# Patient Record
Sex: Male | Born: 2005 | Race: White | Hispanic: No | Marital: Single
Health system: Southern US, Community
[De-identification: ages and names within clinical notes are randomized; demographics above are authoritative.]

---
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
## 2023-07-23 ENCOUNTER — Emergency Department
Admission: EM | Admit: 2023-07-23 | Discharge: 2023-07-23 | Disposition: A | Discharge status: Home / Self Care | Payer: 59 | Attending: Emergency Medicine | Admitting: Emergency Medicine

## 2023-07-23 ENCOUNTER — Emergency Department: Payer: Self-pay

## 2023-07-23 ENCOUNTER — Other Ambulatory Visit: Payer: Self-pay

## 2023-07-23 ENCOUNTER — Encounter: Payer: Self-pay | Admitting: Emergency Medicine

## 2023-07-23 DIAGNOSIS — G44309 Post-traumatic headache, unspecified, not intractable: Secondary | ICD-10-CM | POA: Insufficient documentation

## 2023-07-23 DIAGNOSIS — S0990XA Unspecified injury of head, initial encounter: Secondary | ICD-10-CM | POA: Diagnosis not present

## 2023-07-23 DIAGNOSIS — F0781 Postconcussional syndrome: Secondary | ICD-10-CM | POA: Insufficient documentation

## 2023-07-23 DIAGNOSIS — R519 Headache, unspecified: Secondary | ICD-10-CM | POA: Diagnosis not present

## 2023-07-23 DIAGNOSIS — S199XXA Unspecified injury of neck, initial encounter: Secondary | ICD-10-CM | POA: Diagnosis not present

## 2023-07-23 NOTE — Discharge Instructions (Addendum)
Your exam and CT scans are normal and reassuring at this time.  No signs of a serious head injury.  No evidence of any fractures, swelling, bleeding related to the injury.  You may experience some mild headaches or stiffness.  Follow-up with your primary provider or the medical provider approved by your employer.
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
## 2023-07-23 NOTE — ED Triage Notes (Signed)
Pt to ED via POV, pt states that he fell yesterday and hit his head on the ground. Pt denies LOC but states that he was light headed. Pt states that since the fall he still feels a little off balance. Pt is in NAD, eating candy in triage.
